# Patient Record
Sex: Male | Born: 1970 | Race: White | Hispanic: No | Marital: Married | Smoking: Current every day smoker
Health system: Southern US, Community
[De-identification: ages and names within clinical notes are randomized; demographics above are authoritative.]

## PROBLEM LIST (undated history)

## (undated) DIAGNOSIS — K219 Gastro-esophageal reflux disease without esophagitis: Secondary | ICD-10-CM

## (undated) HISTORY — DX: Gastro-esophageal reflux disease without esophagitis: K21.9

---

## 1995-08-22 HISTORY — PX: MANDIBLE FRACTURE SURGERY: SHX706

## 1997-08-21 HISTORY — PX: VASECTOMY: SHX75

## 2006-12-15 ENCOUNTER — Emergency Department (HOSPITAL_COMMUNITY): Admission: EM | Admit: 2006-12-15 | Discharge: 2006-12-15 | Payer: Self-pay | Admitting: Emergency Medicine

## 2011-08-22 HISTORY — PX: BACK SURGERY: SHX140

## 2012-09-24 ENCOUNTER — Ambulatory Visit (INDEPENDENT_AMBULATORY_CARE_PROVIDER_SITE_OTHER): Payer: 59 | Admitting: Family Medicine

## 2012-09-24 ENCOUNTER — Encounter: Payer: Self-pay | Admitting: Family Medicine

## 2012-09-24 VITALS — BP 122/84 | HR 60 | Resp 16 | Ht 71.0 in | Wt 211.1 lb

## 2012-09-24 DIAGNOSIS — Z0000 Encounter for general adult medical examination without abnormal findings: Secondary | ICD-10-CM

## 2012-09-24 DIAGNOSIS — M549 Dorsalgia, unspecified: Secondary | ICD-10-CM

## 2012-09-24 DIAGNOSIS — Z125 Encounter for screening for malignant neoplasm of prostate: Secondary | ICD-10-CM

## 2012-09-24 DIAGNOSIS — Z13220 Encounter for screening for lipoid disorders: Secondary | ICD-10-CM

## 2012-09-24 DIAGNOSIS — F17200 Nicotine dependence, unspecified, uncomplicated: Secondary | ICD-10-CM

## 2012-09-24 DIAGNOSIS — G8929 Other chronic pain: Secondary | ICD-10-CM

## 2012-09-24 DIAGNOSIS — Z720 Tobacco use: Secondary | ICD-10-CM

## 2012-09-24 NOTE — Patient Instructions (Signed)
Continue multivitamin  Congratulations on the smoking! 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

## 2012-09-24 NOTE — Progress Notes (Signed)
  Subjective:    Patient ID: William Sweeney, male    DOB: 08-07-1971, 42 y.o.   MRN: [id number]  HPI Patient here to establish care. Previous PCP [facility]. Neurosurgeon Dr. Trey Sailors No current medications. He is on a NicoDerm patches she is doing over-the-counter to help him quit smoking. He's been smoke free for the past 10 days he started smoking at age 55.  Back surgery he has history of lumbar fusion with EKG that was done in March of 2013 he had multiple slipped disc at that time. He still gets stiff in the morning has more pain in the morning as well as stiffness in his right ankle however after he moves around or takes an Aleve he has no difficulties.  He is due for physical exam has not had fasting labs. He is due for tetanus booster.  History was reviewed he did tell me that his paternal uncle and paternal grandmother was diagnosed with a type of lymphoma. Follows with the eye doctor and dentist on a regular basis   Review of Systems   GEN- denies fatigue, fever, weight loss,weakness, recent illness HEENT- denies eye drainage, change in vision, nasal discharge, CVS- denies chest pain, palpitations RESP- denies SOB, cough, wheeze ABD- denies N/V, change in stools, abd pain GU- denies dysuria, hematuria, dribbling, incontinence MSK- + joint pain, muscle aches, injury Neuro- denies headache, dizziness, syncope, seizure activity      Objective:   Physical Exam GEN- NAD, alert and oriented x3 HEENT- PERRL, EOMI, non injected sclera, pink conjunctiva, MMM, oropharynx clear Neck- Supple,  CVS- RRR, no murmur RESP-CTAB EXT- No edema Pulses- Radial 2+ Psych- normal affect and mood       Assessment & Plan:

## 2012-09-25 DIAGNOSIS — M549 Dorsalgia, unspecified: Secondary | ICD-10-CM | POA: Insufficient documentation

## 2012-09-25 DIAGNOSIS — G8929 Other chronic pain: Secondary | ICD-10-CM | POA: Insufficient documentation

## 2012-09-25 NOTE — Assessment & Plan Note (Addendum)
Smoke free for 10 days, continue to encourage, using nicoderm system Declined flu shot

## 2012-09-25 NOTE — Assessment & Plan Note (Signed)
OTC meds only, obtain records

## 2012-10-07 NOTE — Progress Notes (Signed)
Patient ID: William Sweeney, male   DOB: [birth year], 42 y.o.   MRN: [id number] Records received from [facility], treated for URI and epididymitis, no labs, no immunizations

## 2012-10-31 LAB — CBC
MCH: 28.8 pg (ref 26.0–34.0)
MCV: 82.3 fL (ref 78.0–100.0)
Platelets: 291 10*3/uL (ref 150–400)
RBC: 5.77 MIL/uL (ref 4.22–5.81)
RDW: 13.7 % (ref 11.5–15.5)
WBC: 9.2 10*3/uL (ref 4.0–10.5)

## 2012-10-31 LAB — PSA: PSA: 0.71 ng/mL (ref <=4.00)

## 2012-10-31 LAB — COMPREHENSIVE METABOLIC PANEL
ALT: 23 U/L (ref 0–53)
Albumin: 3.9 g/dL (ref 3.5–5.2)
CO2: 26 mEq/L (ref 19–32)
Chloride: 106 mEq/L (ref 96–112)
Glucose, Bld: 100 mg/dL — ABNORMAL HIGH (ref 70–99)
Potassium: 4.1 mEq/L (ref 3.5–5.3)
Sodium: 138 mEq/L (ref 135–145)
Total Bilirubin: 0.5 mg/dL (ref 0.3–1.2)
Total Protein: 6.4 g/dL (ref 6.0–8.3)

## 2012-10-31 LAB — LIPID PANEL: LDL Cholesterol: 95 mg/dL (ref 0–99)

## 2012-11-05 ENCOUNTER — Encounter: Payer: Self-pay | Admitting: Family Medicine

## 2012-11-05 ENCOUNTER — Ambulatory Visit (INDEPENDENT_AMBULATORY_CARE_PROVIDER_SITE_OTHER): Payer: 59 | Admitting: Family Medicine

## 2012-11-05 VITALS — BP 130/86 | HR 86 | Resp 16 | Ht 71.0 in | Wt 217.1 lb

## 2012-11-05 DIAGNOSIS — E669 Obesity, unspecified: Secondary | ICD-10-CM

## 2012-11-05 DIAGNOSIS — Z1211 Encounter for screening for malignant neoplasm of colon: Secondary | ICD-10-CM

## 2012-11-05 DIAGNOSIS — Z0000 Encounter for general adult medical examination without abnormal findings: Secondary | ICD-10-CM

## 2012-11-05 LAB — HEMOCCULT GUIAC POC 1CARD (OFFICE)

## 2012-11-05 NOTE — Progress Notes (Signed)
  Subjective:    Patient ID: William Sweeney, male    DOB: 1971/02/16, 42 y.o.   MRN: [id number]  HPI Patient here for complete physical exam. No specific concerns. His fasting labs were reviewed PSA was normal. He has followup eye doctor and dentist. His immunizations are up-to-date.   Review of Systems  GEN- denies fatigue, fever, weight loss,weakness, recent illness HEENT- denies eye drainage, change in vision, nasal discharge, CVS- denies chest pain, palpitations RESP- denies SOB, cough, wheeze ABD- denies N/V, change in stools, abd pain GU- denies dysuria, hematuria, dribbling, incontinence MSK- denies joint pain, muscle aches, injury Neuro- denies headache, dizziness, syncope, seizure activity      Objective:   Physical Exam GEN- NAD, alert and oriented x3 HEENT- PERRL, EOMI, non injected sclera, pink conjunctiva, MMM, oropharynx clear, TM clear bilat Neck- Supple,  CVS- RRR, no murmur RESP-CTAB ABD-NABS,soft,NT,ND Rectal- FOBT neg, normal tone, normal external appearance, prostate smooth, no enlargement EXT- No edema Pulses- Radial, DP- 2+ Neuro- CNII-XII in tact, no focal deficits       Assessment & Plan:

## 2012-11-05 NOTE — Assessment & Plan Note (Signed)
CPE done, immunizations UTD Labs reviewed Start multivitamin

## 2012-11-05 NOTE — Assessment & Plan Note (Signed)
Discussed healthy weight and exercise

## 2012-11-05 NOTE — Patient Instructions (Signed)
I recommend eye visit once a year I recommend dental visit every 6 months Goal is to  Exercise 30 minutes 5 days a week I recommend multivitamin once a day  F/U as needed or 1 once a year for physical

## 2013-08-06 ENCOUNTER — Encounter: Payer: Self-pay | Admitting: Family Medicine

## 2013-08-06 ENCOUNTER — Ambulatory Visit (INDEPENDENT_AMBULATORY_CARE_PROVIDER_SITE_OTHER): Payer: 59 | Admitting: Family Medicine

## 2013-08-06 VITALS — BP 130/80 | HR 72 | Temp 99.0°F | Resp 18 | Ht 69.0 in | Wt 212.0 lb

## 2013-08-06 DIAGNOSIS — J209 Acute bronchitis, unspecified: Secondary | ICD-10-CM

## 2013-08-06 DIAGNOSIS — J019 Acute sinusitis, unspecified: Secondary | ICD-10-CM

## 2013-08-06 DIAGNOSIS — J029 Acute pharyngitis, unspecified: Secondary | ICD-10-CM

## 2013-08-06 LAB — RAPID STREP SCREEN (MED CTR MEBANE ONLY)

## 2013-08-06 MED ORDER — AMOXICILLIN 500 MG PO CAPS: 500.0000 mg | ORAL_CAPSULE | Freq: Three times a day (TID) | ORAL | Status: DC

## 2013-08-06 MED ORDER — GUAIFENESIN-CODEINE 100-10 MG/5ML PO SOLN: 10.0000 mL | Freq: Three times a day (TID) | ORAL | Status: DC | PRN

## 2013-08-06 NOTE — Progress Notes (Signed)
   Subjective:    Patient ID: William Sweeney, male    DOB: Nov 16, 1970, 42 y.o.   MRN: [id number]  HPI  Patient here with cough with production of green sputum worsened over the past 2 weeks. He also had fever last week. He's had some  sinus drainage as well. He has used some over-the-counter cough and congestion medications with minimal improvement. The cough is keeping him up at night. He is a former smoker. Denies any shortness of breath or chest pain.   Review of Systems  GEN- denies fatigue, fever, weight loss,weakness, recent illness HEENT- denies eye drainage, change in vision, nasal discharge, CVS- denies chest pain, palpitations RESP- denies SOB, cough, wheeze ABD- denies N/V, change in stools, abd pain GU- denies dysuria, hematuria, dribbling, incontinence MSK- denies joint pain, muscle aches, injury Neuro- denies headache, dizziness, syncope, seizure activity      Objective:   Physical Exam  GEN- NAD, alert and oriented x3 HEENT- PERRL, EOMI, non injected sclera, pink conjunctiva, MMM, oropharynx mild injection, TM clear bilat no effusion,  + maxillary sinus tenderness, inflammed turbinates,  Nasal drainage  Neck- Supple, shotty  LAD CVS- RRR, no murmur RESP-CTAB EXT- No edema Pulses- Radial 2+         Assessment & Plan:

## 2013-08-06 NOTE — Patient Instructions (Signed)
Mild Bronchitis and Sinusitis Start antibiotics  Robitussin with Codiene Nasal saline  F/U as needed

## 2013-08-06 NOTE — Assessment & Plan Note (Signed)
Amoxicillin, decongest as needed, nasal saline

## 2013-08-06 NOTE — Assessment & Plan Note (Signed)
Cough meds given.

## 2019-11-18 ENCOUNTER — Encounter (HOSPITAL_COMMUNITY): Payer: Self-pay | Admitting: Emergency Medicine

## 2019-11-18 ENCOUNTER — Emergency Department (HOSPITAL_COMMUNITY)
Admission: EM | Admit: 2019-11-18 | Discharge: 2019-11-18 | Disposition: A | Discharge status: Home / Self Care | Payer: 59 | Attending: Emergency Medicine | Admitting: Emergency Medicine

## 2019-11-18 ENCOUNTER — Other Ambulatory Visit: Payer: Self-pay

## 2019-11-18 DIAGNOSIS — R309 Painful micturition, unspecified: Secondary | ICD-10-CM | POA: Diagnosis not present

## 2019-11-18 DIAGNOSIS — R300 Dysuria: Secondary | ICD-10-CM | POA: Diagnosis not present

## 2019-11-18 DIAGNOSIS — R109 Unspecified abdominal pain: Secondary | ICD-10-CM | POA: Diagnosis present

## 2019-11-18 DIAGNOSIS — Z87891 Personal history of nicotine dependence: Secondary | ICD-10-CM | POA: Insufficient documentation

## 2019-11-18 DIAGNOSIS — R110 Nausea: Secondary | ICD-10-CM | POA: Insufficient documentation

## 2019-11-18 DIAGNOSIS — N200 Calculus of kidney: Secondary | ICD-10-CM

## 2019-11-18 LAB — CBC
HCT: 51.3 % (ref 39.0–52.0)
Hemoglobin: 17 g/dL (ref 13.0–17.0)
MCH: 30.1 pg (ref 26.0–34.0)
MCHC: 33.1 g/dL (ref 30.0–36.0)
MCV: 90.8 fL (ref 80.0–100.0)
Platelets: 287 10*3/uL (ref 150–400)
RBC: 5.65 MIL/uL (ref 4.22–5.81)
RDW: 13 % (ref 11.5–15.5)
WBC: 10 10*3/uL (ref 4.0–10.5)
nRBC: 0 % (ref 0.0–0.2)

## 2019-11-18 LAB — BASIC METABOLIC PANEL
Anion gap: 8 (ref 5–15)
BUN: 17 mg/dL (ref 6–20)
CO2: 24 mmol/L (ref 22–32)
Calcium: 9.2 mg/dL (ref 8.9–10.3)
Chloride: 106 mmol/L (ref 98–111)
Creatinine, Ser: 1.11 mg/dL (ref 0.61–1.24)
GFR calc Af Amer: >60 mL/min (ref >60)
GFR calc non Af Amer: >60 mL/min (ref >60)
Glucose, Bld: 109 mg/dL — ABNORMAL HIGH (ref 70–99)
Potassium: 4.5 mmol/L (ref 3.5–5.1)
Sodium: 138 mmol/L (ref 135–145)

## 2019-11-18 LAB — URINALYSIS, ROUTINE W REFLEX MICROSCOPIC
Bilirubin Urine: NEGATIVE
Glucose, UA: NEGATIVE mg/dL
Ketones, ur: NEGATIVE mg/dL
Leukocytes,Ua: NEGATIVE
Nitrite: POSITIVE — AB
Protein, ur: 100 mg/dL — AB
RBC / HPF: >50 RBC/hpf — ABNORMAL HIGH (ref 0–5)
Specific Gravity, Urine: 1.026 (ref 1.005–1.030)
pH: 5 (ref 5.0–8.0)

## 2019-11-18 NOTE — ED Triage Notes (Signed)
Pt reports waking up with left sided kidney pain. Nausea with pain but pain has subsided at this time. Reports taking 3 ibuprofen and AZO before 4 am. Pain when voiding this AM.

## 2019-11-18 NOTE — Discharge Instructions (Signed)
Most likely you passed a kidney stone today which is what caused the blood in your urine and the pain in your left side.  It is possible that the stone is still present and is just not moving right now.  It is possible that the pain could return which you can take ibuprofen for.  However if you continue to have burning when you urinate or start developing a fever or vomiting please return to the emergency room.  Urine culture was done today and results should be present in the next 2 days.  If there are signs of infection you will need an antibiotic.

## 2019-11-18 NOTE — ED Provider Notes (Signed)
[facility] EMERGENCY DEPARTMENT Provider Note   CSN: [id number] Arrival date & time: 11/18/19  4765     History Chief Complaint  Patient presents with  . Flank Pain    William Sweeney is a 49 y.o. male.  Patient is a 50 year old male with a history of chronic back pain and bronchitis presenting today with left flank pain and dysuria.  Patient reports he woke up today with pain in his left flank that continued to escalate to a 10 out of 10 at one point that radiated around to his groin and caused him to have nausea but no vomiting.  He is also noticed since waking up this morning he has had some mild dysuria.  Usually some mild burning right at the beginning of his stream that then resolves.  He did take AZO at home and ibuprofen.  He reports now the pain has resolved but he still had some mild burning when he urinated.  He denies any chronic issues with urination.  No penile discharge.  No fever, diarrhea or other complaints at this time.  He takes no medications regularly.  Currently pain is 0 out of 10.   Flank Pain       History reviewed. No pertinent past medical history.  Patient Active Problem List   Diagnosis Date Noted  . Acute sinusitis 08/06/2013  . Acute bronchitis 08/06/2013  . Routine general medical examination at a health care facility 11/05/2012  . Obesity, unspecified 11/05/2012  . Chronic back pain 09/25/2012    Past Surgical History:  Procedure Laterality Date  . BACK SURGERY  2013  . MANDIBLE FRACTURE SURGERY  1997  . VASECTOMY  1999       Family History  Problem Relation Age of Onset  . Hypertension Mother   . Heart disease Father   . Hyperlipidemia Father     Social History   Tobacco Use  . Smoking status: Former Games developer  . Tobacco comment: trying to quit   Substance Use Topics  . Alcohol use: No  . Drug use: No    Home Medications Prior to Admission medications   Medication Sig Start Date End Date Taking?  Authorizing Provider  ibuprofen (ADVIL) 200 MG tablet Take 600 mg by mouth daily as needed for moderate pain.   Yes [provider]  amoxicillin (AMOXIL) 500 MG capsule Take 1 capsule (500 mg total) by mouth 3 (three) times daily. Patient not taking: Reported on 11/18/2019 08/06/13   Salley Scarlet, MD  guaiFENesin-codeine 100-10 MG/5ML syrup Take 10 mLs by mouth 3 (three) times daily as needed for cough. Patient not taking: Reported on 11/18/2019 08/06/13   Salley Scarlet, MD    Allergies    Patient has no known allergies.  Review of Systems   Review of Systems  Genitourinary: Positive for flank pain.  All other systems reviewed and are negative.   Physical Exam Updated Vital Signs BP (!) 155/106   Pulse 62   Temp 98.4 F (36.9 C) (Oral)   Resp 16   Ht 5\' 10"  (1.778 m)   Wt 95.3 kg   SpO2 98%   BMI 30.13 kg/m   Physical Exam Vitals and nursing note reviewed.  Constitutional:      General: He is not in acute distress.    Appearance: He is well-developed.  HENT:     Head: Normocephalic and atraumatic.  Eyes:     Conjunctiva/sclera: Conjunctivae normal.     Pupils:  Pupils are equal, round, and reactive to light.  Cardiovascular:     Rate and Rhythm: Normal rate and regular rhythm.     Heart sounds: No murmur.  Pulmonary:     Effort: Pulmonary effort is normal. No respiratory distress.     Breath sounds: Normal breath sounds. No wheezing or rales.  Abdominal:     General: There is no distension.     Palpations: Abdomen is soft.     Tenderness: There is no abdominal tenderness. There is no right CVA tenderness, left CVA tenderness, guarding or rebound.  Musculoskeletal:        General: No tenderness. Normal range of motion.     Cervical back: Normal range of motion and neck supple.  Skin:    General: Skin is warm and dry.     Findings: No erythema or rash.  Neurological:     General: No focal deficit present.     Mental Status: He is alert and  oriented to person, place, and time.  Psychiatric:        Mood and Affect: Mood normal.        Behavior: Behavior normal.        Thought Content: Thought content normal.     ED Results / Procedures / Treatments   Labs (all labs ordered are listed, but only abnormal results are displayed) Labs Reviewed  URINALYSIS, ROUTINE W REFLEX MICROSCOPIC - Abnormal; Notable for the following components:      Result Value   Color, Urine AMBER (*)    APPearance CLOUDY (*)    Hgb urine dipstick LARGE (*)    Protein, ur 100 (*)    Nitrite POSITIVE (*)    RBC / HPF >50 (*)    Bacteria, UA FEW (*)    All other components within normal limits  BASIC METABOLIC PANEL - Abnormal; Notable for the following components:   Glucose, Bld 109 (*)    All other components within normal limits  CBC    EKG None  Radiology No results found.  Procedures Procedures (including critical care time)  Medications Ordered in ED Medications - No data to display  ED Course  I have reviewed the triage vital signs and the nursing notes.  Pertinent labs & imaging results that were available during my care of the patient were reviewed by me and considered in my medical decision making (see chart for details).    MDM Rules/Calculators/A&P                      Pt with symptoms consistent with kidney stone.  Denies infectious sx, or GI symptoms.  Low concern for diverticulitis and no risk factors or history suggestive of AAA.  No hx suggestive of GU source (discharge) and otherwise pt is healthy.  UA with nitrite positive but he had just taken AZO as well as large hemoglobin and greater than 50 red cells.  Patient has rare bacteria but 6-10 white cells.  We will send a culture but that this time low suspicion for UTI.  CBC and BMP without acute findings.  Given patient symptoms have now completely resolved other than some mild dysuria at the beginning of his stream do not feel that he warrants imaging.  Did recommend  if the pain returns still most likely a kidney stone and if it does not resolve he can either return to the emergency room or follow-up with urology.  Final Clinical Impression(s) / ED Diagnoses Final  diagnoses:  Kidney stone    Rx / DC Orders ED Discharge Orders    None       Gwyneth Sprout, MD 11/18/19 [phone]

## 2019-11-19 LAB — URINE CULTURE: Culture: NO GROWTH

## 2019-12-22 ENCOUNTER — Encounter (HOSPITAL_COMMUNITY): Payer: Self-pay | Admitting: Emergency Medicine

## 2019-12-22 ENCOUNTER — Emergency Department (HOSPITAL_COMMUNITY): Payer: 59

## 2019-12-22 ENCOUNTER — Other Ambulatory Visit: Payer: Self-pay

## 2019-12-22 ENCOUNTER — Emergency Department (HOSPITAL_COMMUNITY)
Admission: EM | Admit: 2019-12-22 | Discharge: 2019-12-22 | Disposition: A | Discharge status: Home / Self Care | Payer: 59 | Attending: Emergency Medicine | Admitting: Emergency Medicine

## 2019-12-22 DIAGNOSIS — Z79899 Other long term (current) drug therapy: Secondary | ICD-10-CM | POA: Diagnosis not present

## 2019-12-22 DIAGNOSIS — N200 Calculus of kidney: Secondary | ICD-10-CM | POA: Insufficient documentation

## 2019-12-22 DIAGNOSIS — Z87891 Personal history of nicotine dependence: Secondary | ICD-10-CM | POA: Diagnosis not present

## 2019-12-22 DIAGNOSIS — R109 Unspecified abdominal pain: Secondary | ICD-10-CM | POA: Diagnosis present

## 2019-12-22 LAB — BASIC METABOLIC PANEL
Anion gap: 11 (ref 5–15)
BUN: 21 mg/dL — ABNORMAL HIGH (ref 6–20)
CO2: 23 mmol/L (ref 22–32)
Calcium: 9.6 mg/dL (ref 8.9–10.3)
Chloride: 106 mmol/L (ref 98–111)
Creatinine, Ser: 1.55 mg/dL — ABNORMAL HIGH (ref 0.61–1.24)
GFR calc Af Amer: >60 mL/min (ref >60)
GFR calc non Af Amer: 52 mL/min — ABNORMAL LOW (ref >60)
Glucose, Bld: 128 mg/dL — ABNORMAL HIGH (ref 70–99)
Potassium: 3.9 mmol/L (ref 3.5–5.1)
Sodium: 140 mmol/L (ref 135–145)

## 2019-12-22 LAB — HEPATIC FUNCTION PANEL
ALT: 22 U/L (ref 0–44)
AST: 21 U/L (ref 15–41)
Albumin: 4.8 g/dL (ref 3.5–5.0)
Alkaline Phosphatase: 81 U/L (ref 38–126)
Bilirubin, Direct: 0.1 mg/dL (ref 0.0–0.2)
Indirect Bilirubin: 0.7 mg/dL (ref 0.3–0.9)
Total Bilirubin: 0.8 mg/dL (ref 0.3–1.2)
Total Protein: 7.8 g/dL (ref 6.5–8.1)

## 2019-12-22 LAB — CBC
HCT: 52.5 % — ABNORMAL HIGH (ref 39.0–52.0)
Hemoglobin: 17.3 g/dL — ABNORMAL HIGH (ref 13.0–17.0)
MCH: 30 pg (ref 26.0–34.0)
MCHC: 33 g/dL (ref 30.0–36.0)
MCV: 91 fL (ref 80.0–100.0)
Platelets: 285 10*3/uL (ref 150–400)
RBC: 5.77 MIL/uL (ref 4.22–5.81)
RDW: 13.1 % (ref 11.5–15.5)
WBC: 24.1 10*3/uL — ABNORMAL HIGH (ref 4.0–10.5)
nRBC: 0 % (ref 0.0–0.2)

## 2019-12-22 LAB — URINALYSIS, ROUTINE W REFLEX MICROSCOPIC
Bilirubin Urine: NEGATIVE
Glucose, UA: 50 mg/dL — AB
Ketones, ur: 5 mg/dL — AB
Leukocytes,Ua: NEGATIVE
Nitrite: POSITIVE — AB
Protein, ur: 30 mg/dL — AB
RBC / HPF: >50 RBC/hpf — ABNORMAL HIGH (ref 0–5)
Specific Gravity, Urine: 1.023 (ref 1.005–1.030)
pH: 5 (ref 5.0–8.0)

## 2019-12-22 MED ORDER — HYDROCODONE-ACETAMINOPHEN 5-325 MG PO TABS: 1.0000 | ORAL_TABLET | Freq: Four times a day (QID) | ORAL | 0 refills | Status: AC | PRN

## 2019-12-22 MED ORDER — TAMSULOSIN HCL 0.4 MG PO CAPS: 0.4000 mg | ORAL_CAPSULE | Freq: Every day | ORAL | 0 refills | Status: DC

## 2019-12-22 MED ORDER — CEPHALEXIN 500 MG PO CAPS: 500.0000 mg | ORAL_CAPSULE | Freq: Four times a day (QID) | ORAL | 0 refills | Status: AC

## 2019-12-22 MED ORDER — ONDANSETRON 4 MG PO TBDP: ORAL_TABLET | ORAL | 0 refills | Status: AC

## 2019-12-22 MED ORDER — HYDROMORPHONE HCL 1 MG/ML IJ SOLN
0.5000 mg | Freq: Once | INTRAMUSCULAR | Status: AC
  Administered 2019-12-22: 12:00:00 0.5 mg via INTRAVENOUS
  Filled 2019-12-22: qty 1

## 2019-12-22 MED ORDER — KETOROLAC TROMETHAMINE 30 MG/ML IJ SOLN
30.0000 mg | Freq: Once | INTRAMUSCULAR | Status: AC
  Administered 2019-12-22: 30 mg via INTRAVENOUS
  Filled 2019-12-22: qty 1

## 2019-12-22 MED ORDER — KETOROLAC TROMETHAMINE 30 MG/ML IJ SOLN: 30.0000 mg | Freq: Once | INTRAMUSCULAR | Status: DC

## 2019-12-22 MED ORDER — ONDANSETRON HCL 4 MG/2ML IJ SOLN: 4.0000 mg | Freq: Once | INTRAMUSCULAR | Status: DC

## 2019-12-22 MED ORDER — ONDANSETRON HCL 4 MG/2ML IJ SOLN
4.0000 mg | Freq: Once | INTRAMUSCULAR | Status: AC
  Administered 2019-12-22: 11:00:00 4 mg via INTRAVENOUS
  Filled 2019-12-22: qty 2

## 2019-12-22 NOTE — ED Notes (Signed)
Pt not able to void at this time.

## 2019-12-22 NOTE — ED Notes (Signed)
Ambulated to BR without difficulty.  Attempting urine sample.

## 2019-12-22 NOTE — Discharge Instructions (Signed)
Follow-up with [facility] the end of this week or the beginning next week

## 2019-12-22 NOTE — ED Triage Notes (Addendum)
Pt reports left flank pain that radiates to LLQ of abdomen. Pt reports pain onset 430 this am with emesis,nausea. Pt reports urinates but reports "not emptying bladder completely." pt diaphoretic in triage.

## 2019-12-22 NOTE — ED Provider Notes (Signed)
[facility] EMERGENCY DEPARTMENT Provider Note   CSN: [id number] Arrival date & time: 12/22/19  7124     History Chief Complaint  Patient presents with   Flank Pain    William Sweeney is a 49 y.o. male.  Patient with left flank pain, and nausea which started a few days ago  The history is provided by the patient. No language interpreter was used.  Flank Pain This is a new problem. The current episode started 2 days ago. The problem occurs constantly. The problem has not changed since onset.Pertinent negatives include no chest pain, no abdominal pain and no headaches. Nothing aggravates the symptoms. Nothing relieves the symptoms. He has tried nothing for the symptoms. The treatment provided no relief.       History reviewed. No pertinent past medical history.  Patient Active Problem List   Diagnosis Date Noted   Acute sinusitis 08/06/2013   Acute bronchitis 08/06/2013   Routine general medical examination at a health care facility 11/05/2012   Obesity, unspecified 11/05/2012   Chronic back pain 09/25/2012    Past Surgical History:  Procedure Laterality Date   BACK SURGERY  2013   MANDIBLE FRACTURE SURGERY  1997   VASECTOMY  1999       Family History  Problem Relation Age of Onset   Hypertension Mother    Heart disease Father    Hyperlipidemia Father     Social History   Tobacco Use   Smoking status: Former Smoker   Smokeless tobacco: Never Used   Tobacco comment: trying to quit   Substance Use Topics   Alcohol use: No   Drug use: No    Home Medications Prior to Admission medications   Medication Sig Start Date End Date Taking? Authorizing Provider  ibuprofen (ADVIL) 200 MG tablet Take 600 mg by mouth daily as needed for moderate pain.   Yes [provider]  phenazopyridine (PYRIDIUM) 95 MG tablet Take 190 mg by mouth 3 (three) times daily as needed for pain.   Yes [provider]  cephALEXin (KEFLEX) 500 MG capsule  Take 1 capsule (500 mg total) by mouth 4 (four) times daily. 12/22/19   Bethann Berkshire, MD  HYDROcodone-acetaminophen (NORCO/VICODIN) 5-325 MG tablet Take 1 tablet by mouth every 6 (six) hours as needed. 12/22/19   Bethann Berkshire, MD  ondansetron (ZOFRAN ODT) 4 MG disintegrating tablet 4mg  ODT q4 hours prn nausea/vomit 12/22/19   02/21/20, MD  tamsulosin (FLOMAX) 0.4 MG CAPS capsule Take 1 capsule (0.4 mg total) by mouth daily. 12/22/19   02/21/20, MD    Allergies    Patient has no known allergies.  Review of Systems   Review of Systems  Constitutional: Negative for appetite change and fatigue.  HENT: Negative for congestion, ear discharge and sinus pressure.   Eyes: Negative for discharge.  Respiratory: Negative for cough.   Cardiovascular: Negative for chest pain.  Gastrointestinal: Negative for abdominal pain and diarrhea.  Genitourinary: Positive for flank pain. Negative for frequency and hematuria.  Musculoskeletal: Negative for back pain.  Skin: Negative for rash.  Neurological: Negative for seizures and headaches.  Psychiatric/Behavioral: Negative for hallucinations.    Physical Exam Updated Vital Signs BP 127/71 (BP Location: Right Arm)    Pulse 67    Temp 99.1 F (37.3 C) (Oral)    Resp 18    Ht 5\' 10"  (1.778 m)    Wt 97.5 kg    SpO2 98%    BMI 30.85 kg/m  Physical Exam Vitals and nursing note reviewed.  Constitutional:      Appearance: He is well-developed.  HENT:     Head: Normocephalic.     Nose: Nose normal.  Eyes:     General: No scleral icterus.    Conjunctiva/sclera: Conjunctivae normal.  Neck:     Thyroid: No thyromegaly.  Cardiovascular:     Rate and Rhythm: Normal rate and regular rhythm.     Heart sounds: No murmur. No friction rub. No gallop.   Pulmonary:     Breath sounds: No stridor. No wheezing or rales.  Chest:     Chest wall: No tenderness.  Abdominal:     General: There is no distension.     Tenderness: There is no abdominal  tenderness. There is no rebound.  Genitourinary:    Comments: Tender left flank Musculoskeletal:        General: Normal range of motion.     Cervical back: Neck supple.  Lymphadenopathy:     Cervical: No cervical adenopathy.  Skin:    Findings: No erythema or rash.  Neurological:     Mental Status: He is alert and oriented to person, place, and time.     Motor: No abnormal muscle tone.     Coordination: Coordination normal.  Psychiatric:        Behavior: Behavior normal.     ED Results / Procedures / Treatments   Labs (all labs ordered are listed, but only abnormal results are displayed) Labs Reviewed  URINALYSIS, ROUTINE W REFLEX MICROSCOPIC - Abnormal; Notable for the following components:      Result Value   Color, Urine AMBER (*)    Glucose, UA 50 (*)    Hgb urine dipstick MODERATE (*)    Ketones, ur 5 (*)    Protein, ur 30 (*)    Nitrite POSITIVE (*)    RBC / HPF >50 (*)    Bacteria, UA RARE (*)    All other components within normal limits  BASIC METABOLIC PANEL - Abnormal; Notable for the following components:   Glucose, Bld 128 (*)    BUN 21 (*)    Creatinine, Ser 1.55 (*)    GFR calc non Af Amer 52 (*)    All other components within normal limits  CBC - Abnormal; Notable for the following components:   WBC 24.1 (*)    Hemoglobin 17.3 (*)    HCT 52.5 (*)    All other components within normal limits  URINE CULTURE  HEPATIC FUNCTION PANEL    EKG None  Radiology CT Renal Stone Study  Result Date: 12/22/2019 CLINICAL DATA:  Onset left flank pain radiating into the left lower quadrant at 4:30 a.m. today. EXAM: CT ABDOMEN AND PELVIS WITHOUT CONTRAST TECHNIQUE: Multidetector CT imaging of the abdomen and pelvis was performed following the standard protocol without IV contrast. COMPARISON:  None. FINDINGS: Lower chest: Lung bases clear.  No pleural or pericardial effusion. Hepatobiliary: A few small low attenuating foci in the liver such as a 0.8 cm lesion in the  caudate lobe on image 24 cannot be definitively characterized but are likely cysts. The liver otherwise appears normal. Gallbladder and biliary tree normal in appearance. Pancreas: Unremarkable. No pancreatic ductal dilatation or surrounding inflammatory changes. Spleen: Calcifications in the spleen consistent with old granulomatous disease noted. The spleen is normal in size. Adrenals/Urinary Tract: Small bilateral adrenal adenomas are noted. There is mild-to-moderate left hydronephrosis and stranding about the left kidney and proximal ureter due  to a 0.5 cm proximal to mid left ureteral stone. No other urinary tract stones are seen on the right or left. The kidneys are otherwise normal appearance. Urinary bladder appears normal. Stomach/Bowel: Stomach is within normal limits. Appendix appears normal. No evidence of bowel wall thickening, distention, or inflammatory changes. Vascular/Lymphatic: No significant vascular findings are present. No enlarged abdominal or pelvic lymph nodes. Reproductive: Prostate is unremarkable. Other: None. Musculoskeletal: No acute or focal bony abnormality. 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 The patient is status post L5-S1 discectomy and fusion. IMPRESSION: Mild to moderate left hydronephrosis due to a 0.5 cm proximal to mid left ureteral stone. No other acute abnormality. Electronically Signed   By: Drusilla Kanner M.D.   On: 12/22/2019 12:09    Procedures Procedures (including critical care time)  Medications Ordered in ED Medications  ondansetron (ZOFRAN) injection 4 mg (4 mg Intravenous Given 12/22/19 1110)  ketorolac (TORADOL) 30 MG/ML injection 30 mg (30 mg Intravenous Given 12/22/19 1110)  HYDROmorphone (DILAUDID) injection 0.5 mg (0.5 mg Intravenous Given 12/22/19 1132)    ED Course  I have reviewed the triage vital signs and the nursing notes.  Pertinent labs & imaging results that were available during my care of the patient were reviewed  by me and considered in my medical decision making (see chart for details).    MDM Rules/Calculators/A&P                      Patient with a kidney stone.  He was discharged on Zofran Keflex Flomax and Vicodin and will follow up with urology     This patient presents to the ED for concern of flank pain this involves an extensive number of treatment options, and is a complaint that carries with it a high risk of complications and morbidity.  The differential diagnosis includes kidney stone urinary tract infection   Lab Tests:   I Ordered, reviewed, and interpreted labs, which included CBC chemistries urinalysis  Medicines ordered:   I ordered medication Zofran Dilaudid Toradol for pain and nausea  Imaging Studies ordered:   I ordered imaging studies which included CT scan abdomen and  I independently visualized and interpreted imaging which showed ureteral stone on the left  Additional history obtained:   Additional history obtained from records  Previous records obtained and reviewed  Consultations Obtained:   Reevaluation:  After the interventions stated above, I reevaluated the patient and found much improved  Critical Interventions:     Final Clinical Impression(s) / ED Diagnoses Final diagnoses:  Kidney stone    Rx / DC Orders ED Discharge Orders         Ordered    ondansetron (ZOFRAN ODT) 4 MG disintegrating tablet     12/22/19 1519    cephALEXin (KEFLEX) 500 MG capsule  4 times daily     12/22/19 1519    tamsulosin (FLOMAX) 0.4 MG CAPS capsule  Daily     12/22/19 1519    HYDROcodone-acetaminophen (NORCO/VICODIN) 5-325 MG tablet  Every 6 hours PRN     12/22/19 1519           Bethann Berkshire, MD 12/23/19 1615

## 2019-12-23 LAB — URINE CULTURE: Culture: NO GROWTH

## 2019-12-29 ENCOUNTER — Encounter: Payer: Self-pay | Admitting: Urology

## 2019-12-29 ENCOUNTER — Ambulatory Visit: Payer: 59 | Admitting: Urology

## 2019-12-29 ENCOUNTER — Other Ambulatory Visit: Payer: Self-pay

## 2019-12-29 VITALS — BP 143/71 | HR 64 | Temp 98.8°F | Ht 70.0 in | Wt 215.0 lb

## 2019-12-29 DIAGNOSIS — N200 Calculus of kidney: Secondary | ICD-10-CM | POA: Insufficient documentation

## 2019-12-29 LAB — POCT URINALYSIS DIPSTICK
Bilirubin, UA: NEGATIVE
Glucose, UA: NEGATIVE
Ketones, UA: NEGATIVE
Nitrite, UA: NEGATIVE
Protein, UA: NEGATIVE
Spec Grav, UA: <=1.005 — AB (ref 1.010–1.025)
Urobilinogen, UA: 0.2 E.U./dL
pH, UA: 6 (ref 5.0–8.0)

## 2019-12-29 MED ORDER — OXYCODONE-ACETAMINOPHEN 5-325 MG PO TABS: 1.0000 | ORAL_TABLET | ORAL | 0 refills | Status: AC | PRN

## 2019-12-29 MED ORDER — TAMSULOSIN HCL 0.4 MG PO CAPS: 0.4000 mg | ORAL_CAPSULE | Freq: Every day | ORAL | 0 refills | Status: DC

## 2019-12-29 NOTE — Progress Notes (Signed)
Urological Symptom Review  Patient is experiencing the following symptoms: Burning/pain with urination Get up at night to urinate Stream starts and stops Trouble starting stream Weak stream   Review of Systems  Gastrointestinal (upper)  : Negative for upper GI symptoms  Gastrointestinal (lower) : Negative for lower GI symptoms  Constitutional : Negative for symptoms  Skin: Negative for skin symptoms  Eyes: Negative for eye symptoms  Ear/Nose/Throat : Negative for Ear/Nose/Throat symptoms  Hematologic/Lymphatic: Negative for Hematologic/Lymphatic symptoms  Cardiovascular : Negative for cardiovascular symptoms  Respiratory : Negative for respiratory symptoms  Endocrine: Negative for endocrine symptoms  Musculoskeletal: Back pain  Neurological: Negative for neurological symptoms  Psychologic: Negative for psychiatric symptoms

## 2019-12-29 NOTE — Progress Notes (Signed)
12/29/2019 3:57 PM   William Sweeney 03/06/1971 [id number]  Referring provider: No referring provider defined for this encounter.  Left flank pain  HPI: William Sweeney is a 48yo her for evaluation of nephrolithiasis. On 5/3 he developed sharp, intermittent severe and nonradiating left flank pain. CT from [facility] shows a 56mm proximal ureteral calculus. This is his first stone event. NO family hx of nephrolithiasis. No LUTS. No hematuria. UA today shows intact blood. No flank pain currently. He does not believe he passed his calculus.  No nausea or vomiting. No fevers/chills/sweats  PMH: Past Medical History:  Diagnosis Date  . Acid reflux     Surgical History: Past Surgical History:  Procedure Laterality Date  . BACK SURGERY  2013  . MANDIBLE FRACTURE SURGERY  1997  . VASECTOMY  1999    Home Medications:  Allergies as of 12/29/2019   No Known Allergies     Medication List       Accurate as of Dec 29, 2019  3:57 PM. If you have any questions, ask your nurse or doctor.        cephALEXin 500 MG capsule Commonly known as: KEFLEX Take 1 capsule (500 mg total) by mouth 4 (four) times daily.   HYDROcodone-acetaminophen 5-325 MG tablet Commonly known as: NORCO/VICODIN Take 1 tablet by mouth every 6 (six) hours as needed.   ibuprofen 200 MG tablet Commonly known as: ADVIL Take 600 mg by mouth daily as needed for moderate pain.   ondansetron 4 MG disintegrating tablet Commonly known as: Zofran ODT 4mg  ODT q4 hours prn nausea/vomit   phenazopyridine 95 MG tablet Commonly known as: PYRIDIUM Take 190 mg by mouth 3 (three) times daily as needed for pain.   tamsulosin 0.4 MG Caps capsule Commonly known as: Flomax Take 1 capsule (0.4 mg total) by mouth daily.       Allergies: No Known Allergies  Family History: Family History  Problem Relation Age of Onset  . Hypertension Mother   . Heart disease Father   . Hyperlipidemia Father   . Heart attack Father     Social  History:  reports that he has been smoking. He has a 34.00 pack-year smoking history. He has never used smokeless tobacco. He reports that he does not drink alcohol or use drugs.  ROS: All other review of systems were reviewed and are negative except what is noted above in HPI  Physical Exam: BP (!) 143/71   Pulse 64   Temp 98.8 F (37.1 C)   Ht 5\' 10"  (1.778 m)   Wt 215 lb (97.5 kg)   BMI 30.85 kg/m   Constitutional:  Alert and oriented, No acute distress. HEENT: [place] AT, moist mucus membranes.  Trachea midline, no masses. Cardiovascular: No clubbing, cyanosis, or edema. Respiratory: Normal respiratory effort, no increased work of breathing. GI: Abdomen is soft, nontender, nondistended, no abdominal masses GU: No CVA tenderness.  Lymph: No cervical or inguinal lymphadenopathy. Skin: No rashes, bruises or suspicious lesions. Neurologic: Grossly intact, no focal deficits, moving all 4 extremities. Psychiatric: Normal mood and affect.  Laboratory Data: Lab Results  Component Value Date   WBC 24.1 (H) 12/22/2019   HGB 17.3 (H) 12/22/2019   HCT 52.5 (H) 12/22/2019   MCV 91.0 12/22/2019   PLT 285 12/22/2019    Lab Results  Component Value Date   CREATININE 1.55 (H) 12/22/2019    Lab Results  Component Value Date   PSA 0.71 09/24/2012    No results  found for: TESTOSTERONE  No results found for: HGBA1C  Urinalysis    Component Value Date/Time   COLORURINE AMBER (A) 12/22/2019 1410   APPEARANCEUR CLEAR 12/22/2019 1410   LABSPEC 1.023 12/22/2019 1410   PHURINE 5.0 12/22/2019 1410   GLUCOSEU 50 (A) 12/22/2019 1410   HGBUR MODERATE (A) 12/22/2019 1410   BILIRUBINUR neg 12/29/2019 1553   KETONESUR 5 (A) 12/22/2019 1410   PROTEINUR Negative 12/29/2019 1553   PROTEINUR 30 (A) 12/22/2019 1410   UROBILINOGEN 0.2 12/29/2019 1553   NITRITE neg 12/29/2019 1553   NITRITE POSITIVE (A) 12/22/2019 1410   LEUKOCYTESUR Small (1+) (A) 12/29/2019 1553   LEUKOCYTESUR NEGATIVE  12/22/2019 1410    Lab Results  Component Value Date   BACTERIA RARE (A) 12/22/2019    Pertinent Imaging: CT 5/3: images reviewed and discussed with patient No results found for this or any previous visit. No results found for this or any previous visit. No results found for this or any previous visit. No results found for this or any previous visit. No results found for this or any previous visit. No results found for this or any previous visit. No results found for this or any previous visit. Results for orders placed during the hospital encounter of 12/22/19  CT Renal Stone Study   Narrative CLINICAL DATA:  Onset left flank pain radiating into the left lower quadrant at 4:30 a.m. today.  EXAM: CT ABDOMEN AND PELVIS WITHOUT CONTRAST  TECHNIQUE: Multidetector CT imaging of the abdomen and pelvis was performed following the standard protocol without IV contrast.  COMPARISON:  None.  FINDINGS: Lower chest: Lung bases clear.  No pleural or pericardial effusion.  Hepatobiliary: A few small low attenuating foci in the liver such as a 0.8 cm lesion in the caudate lobe on image 24 cannot be definitively characterized but are likely cysts. The liver otherwise appears normal. Gallbladder and biliary tree normal in appearance.  Pancreas: Unremarkable. No pancreatic ductal dilatation or surrounding inflammatory changes.  Spleen: Calcifications in the spleen consistent with old granulomatous disease noted. The spleen is normal in size.  Adrenals/Urinary Tract: Small bilateral adrenal adenomas are noted.  There is mild-to-moderate left hydronephrosis and stranding about the left kidney and proximal ureter due to a 0.5 cm proximal to mid left ureteral stone. No other urinary tract stones are seen on the right or left. The kidneys are otherwise normal appearance. Urinary bladder appears normal.  Stomach/Bowel: Stomach is within normal limits. Appendix appears normal. No  evidence of bowel wall thickening, distention, or inflammatory changes.  Vascular/Lymphatic: No significant vascular findings are present. No enlarged abdominal or pelvic lymph nodes.  Reproductive: Prostate is unremarkable.  Other: None.  Musculoskeletal: No acute or focal bony abnormality. 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 The patient is status post L5-S1 discectomy and fusion.  IMPRESSION: Mild to moderate left hydronephrosis due to a 0.5 cm proximal to mid left ureteral stone. No other acute abnormality.   Electronically Signed   By: Drusilla Kanner M.D.   On: 12/22/2019 12:09     Assessment & Plan:    1. Kidney stones --We discussed the management of kidney stones. These options include observation, ureteroscopy, shockwave lithotripsy (ESWL) and percutaneous nephrolithotomy (PCNL). We discussed which options are relevant to the patient's stone(s). We discussed the natural history of kidney stones as well as the complications of untreated stones and the impact on quality of life without treatment as well as with each of the above  listed treatments. We also discussed the efficacy of each treatment in its ability to clear the stone burden. With any of these management options I discussed the signs and symptoms of infection and the need for emergent treatment should these be experienced. For each option we discussed the ability of each procedure to clear the patient of their stone burden.   For observation I described the risks which include but are not limited to silent renal damage, life-threatening infection, need for emergent surgery, failure to pass stone and pain.   For ureteroscopy I described the risks which include bleeding, infection, damage to contiguous structures, positioning injury, ureteral stricture, ureteral avulsion, ureteral injury, need for prolonged ureteral stent, inability to perform ureteroscopy, need for an interval procedure,  inability to clear stone burden, stent discomfort/pain, heart attack, stroke, pulmonary embolus and the inherent risks with general anesthesia.   For shockwave lithotripsy I described the risks which include arrhythmia, kidney contusion, kidney hemorrhage, need for transfusion, pain, inability to adequately break up stone, inability to pass stone fragments, Steinstr[place], infection associated with obstructing stones, need for alternate surgical procedure, need for repeat shockwave lithotripsy, MI, CVA, PE and the inherent risks with anesthesia/conscious sedation.   For PCNL I described the risks including positioning injury, pneumothorax, hydrothorax, need for chest tube, inability to clear stone burden, renal laceration, arterial venous fistula or malformation, need for embolization of kidney, loss of kidney or renal function, need for repeat procedure, need for prolonged nephrostomy tube, ureteral avulsion, MI, CVA, PE and the inherent risks of general anesthesia.   - The patient would like to proceed with medical expulsive therapy. Rx for flomax and percocet given  - POCT urinalysis dipstick   No follow-ups on file.  Nicolette Bang, MD  [facility] [place]

## 2019-12-29 NOTE — Patient Instructions (Signed)

## 2020-01-12 ENCOUNTER — Other Ambulatory Visit: Payer: Self-pay

## 2020-01-12 ENCOUNTER — Ambulatory Visit (INDEPENDENT_AMBULATORY_CARE_PROVIDER_SITE_OTHER): Payer: 59 | Admitting: Urology

## 2020-01-12 ENCOUNTER — Encounter: Payer: Self-pay | Admitting: Urology

## 2020-01-12 VITALS — BP 136/75 | HR 61 | Temp 98.7°F | Ht 70.0 in | Wt 215.0 lb

## 2020-01-12 DIAGNOSIS — N200 Calculus of kidney: Secondary | ICD-10-CM

## 2020-01-12 NOTE — Progress Notes (Signed)
01/12/2020 11:38 AM   William Sweeney 1971/05/26 [id number]  Referring provider: No referring provider defined for this encounter.  Nephrolithiasis  HPI: William Sweeney is a 49yo here for followup for nephrolithaisis. His urine was dark brown on Saturday and then he thinks he passed his calculus. No flank pain. NO fevers/no dysuria. Pt did not get KUB   PMH: Past Medical History:  Diagnosis Date  . Acid reflux     Surgical History: Past Surgical History:  Procedure Laterality Date  . BACK SURGERY  2013  . [place]  . VASECTOMY  1999    Home Medications:  Allergies as of 01/12/2020   No Known Allergies     Medication List       Accurate as of Jan 12, 2020 11:38 AM. If you have any questions, ask your nurse or doctor.        cephALEXin 500 MG capsule Commonly known as: KEFLEX Take 1 capsule (500 mg total) by mouth 4 (four) times daily.   HYDROcodone-acetaminophen 5-325 MG tablet Commonly known as: NORCO/VICODIN Take 1 tablet by mouth every 6 (six) hours as needed.   ibuprofen 200 MG tablet Commonly known as: ADVIL Take 600 mg by mouth daily as needed for moderate pain.   ondansetron 4 MG disintegrating tablet Commonly known as: Zofran ODT 4mg  ODT q4 hours prn nausea/vomit   oxyCODONE-acetaminophen 5-325 MG tablet Commonly known as: Percocet Take 1 tablet by mouth every 4 (four) hours as needed for moderate pain or severe pain.   phenazopyridine 95 MG tablet Commonly known as: PYRIDIUM Take 190 mg by mouth 3 (three) times daily as needed for pain.   tamsulosin 0.4 MG Caps capsule Commonly known as: Flomax Take 1 capsule (0.4 mg total) by mouth daily.       Allergies: No Known Allergies  Family History: Family History  Problem Relation Age of Onset  . Hypertension Mother   . Heart disease Father   . Hyperlipidemia Father   . Heart attack Father     Social History:  reports that he has been smoking. He has a 34.00 pack-year  smoking history. He has never used smokeless tobacco. He reports that he does not drink alcohol or use drugs.  ROS: All other review of systems were reviewed and are negative except what is noted above in HPI  Physical Exam: BP 136/75   Pulse 61   Temp 98.7 F (37.1 C)   Ht 5\' 10"  (1.778 m)   Wt 215 lb (97.5 kg)   BMI 30.85 kg/m   Constitutional:  Alert and oriented, No acute distress. HEENT: [place] AT, moist mucus membranes.  Trachea midline, no masses. Cardiovascular: No clubbing, cyanosis, or edema. Respiratory: Normal respiratory effort, no increased work of breathing. GI: Abdomen is soft, nontender, nondistended, no abdominal masses GU: No CVA tenderness.  Lymph: No cervical or inguinal lymphadenopathy. Skin: No rashes, bruises or suspicious lesions. Neurologic: Grossly intact, no focal deficits, moving all 4 extremities. Psychiatric: Normal mood and affect.  Laboratory Data: Lab Results  Component Value Date   WBC 24.1 (H) 12/22/2019   HGB 17.3 (H) 12/22/2019   HCT 52.5 (H) 12/22/2019   MCV 91.0 12/22/2019   PLT 285 12/22/2019    Lab Results  Component Value Date   CREATININE 1.55 (H) 12/22/2019    Lab Results  Component Value Date   PSA 0.71 09/24/2012    No results found for: TESTOSTERONE  No results found for: HGBA1C  Urinalysis    Component Value Date/Time   COLORURINE AMBER (A) 12/22/2019 1410   APPEARANCEUR CLEAR 12/22/2019 1410   LABSPEC 1.023 12/22/2019 1410   PHURINE 5.0 12/22/2019 1410   GLUCOSEU 50 (A) 12/22/2019 1410   HGBUR MODERATE (A) 12/22/2019 1410   BILIRUBINUR neg 12/29/2019 1553   KETONESUR 5 (A) 12/22/2019 1410   PROTEINUR Negative 12/29/2019 1553   PROTEINUR 30 (A) 12/22/2019 1410   UROBILINOGEN 0.2 12/29/2019 1553   NITRITE neg 12/29/2019 1553   NITRITE POSITIVE (A) 12/22/2019 1410   LEUKOCYTESUR Small (1+) (A) 12/29/2019 1553   LEUKOCYTESUR NEGATIVE 12/22/2019 1410    Lab Results  Component Value Date   BACTERIA RARE (A)  12/22/2019    Pertinent Imaging:  No results found for this or any previous visit. No results found for this or any previous visit. No results found for this or any previous visit. No results found for this or any previous visit. No results found for this or any previous visit. No results found for this or any previous visit. No results found for this or any previous visit. Results for orders placed during the hospital encounter of 12/22/19  CT Renal Stone Study   Narrative CLINICAL DATA:  Onset left flank pain radiating into the left lower quadrant at 4:30 a.m. today.  EXAM: CT ABDOMEN AND PELVIS WITHOUT CONTRAST  TECHNIQUE: Multidetector CT imaging of the abdomen and pelvis was performed following the standard protocol without IV contrast.  COMPARISON:  None.  FINDINGS: Lower chest: Lung bases clear.  No pleural or pericardial effusion.  Hepatobiliary: A few small low attenuating foci in the liver such as a 0.8 cm lesion in the caudate lobe on image 24 cannot be definitively characterized but are likely cysts. The liver otherwise appears normal. Gallbladder and biliary tree normal in appearance.  Pancreas: Unremarkable. No pancreatic ductal dilatation or surrounding inflammatory changes.  Spleen: Calcifications in the spleen consistent with old granulomatous disease noted. The spleen is normal in size.  Adrenals/Urinary Tract: Small bilateral adrenal adenomas are noted.  There is mild-to-moderate left hydronephrosis and stranding about the left kidney and proximal ureter due to a 0.5 cm proximal to mid left ureteral stone. No other urinary tract stones are seen on the right or left. The kidneys are otherwise normal appearance. Urinary bladder appears normal.  Stomach/Bowel: Stomach is within normal limits. Appendix appears normal. No evidence of bowel wall thickening, distention, or inflammatory changes.  Vascular/Lymphatic: No significant vascular findings are  present. No enlarged abdominal or pelvic lymph nodes.  Reproductive: Prostate is unremarkable.  Other: None.  Musculoskeletal: No acute or focal bony abnormality. 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 The patient is status post L5-S1 discectomy and fusion.  IMPRESSION: Mild to moderate left hydronephrosis due to a 0.5 cm proximal to mid left ureteral stone. No other acute abnormality.   Electronically Signed   By: Drusilla Kanner M.D.   On: 12/22/2019 12:09     Assessment & Plan:    1. Kidney stones -KUB today, will call with  Results. If stone is still present patient wants to pursue medical expulsive therapy. Continue flomax 0.4mg  daily. RTC 2 weeks with KUB   No follow-ups on file.  Wilkie Aye, MD  [facility] [place]

## 2020-01-12 NOTE — Progress Notes (Signed)

## 2020-02-02 ENCOUNTER — Other Ambulatory Visit: Payer: Self-pay

## 2020-02-02 ENCOUNTER — Ambulatory Visit (HOSPITAL_COMMUNITY)
Admission: RE | Admit: 2020-02-02 | Discharge: 2020-02-02 | Disposition: A | Discharge status: Home / Self Care | Payer: 59 | Source: Ambulatory Visit | Attending: Urology | Admitting: Urology

## 2020-02-02 DIAGNOSIS — N200 Calculus of kidney: Secondary | ICD-10-CM | POA: Diagnosis present

## 2020-02-04 ENCOUNTER — Other Ambulatory Visit: Payer: Self-pay

## 2020-02-04 ENCOUNTER — Ambulatory Visit: Payer: 59 | Admitting: Urology

## 2020-02-04 ENCOUNTER — Encounter: Payer: Self-pay | Admitting: Urology

## 2020-02-04 VITALS — BP 113/68 | HR 63 | Temp 98.8°F | Ht 70.0 in | Wt 215.0 lb

## 2020-02-04 DIAGNOSIS — N200 Calculus of kidney: Secondary | ICD-10-CM

## 2020-02-04 MED ORDER — TAMSULOSIN HCL 0.4 MG PO CAPS: 0.4000 mg | ORAL_CAPSULE | Freq: Every day | ORAL | 3 refills | Status: AC

## 2020-02-04 NOTE — Patient Instructions (Signed)
Dietary Guidelines to Help Prevent Kidney Stones Kidney stones are deposits of minerals and salts that form inside your kidneys. Your risk of developing kidney stones may be greater depending on your diet, your lifestyle, the medicines you take, and whether you have certain medical conditions. Most people can reduce their chances of developing kidney stones by following the instructions below. Depending on your overall health and the type of kidney stones you tend to develop, your dietitian may give you more specific instructions. What are tips for following this plan? Reading food labels  Choose foods with "no salt added" or "low-salt" labels. Limit your sodium intake to less than 1500 mg per day.  Choose foods with calcium for each meal and snack. Try to eat about 300 mg of calcium at each meal. Foods that contain 200-500 mg of calcium per serving include: ? 8 oz (237 ml) of milk, fortified nondairy milk, and fortified fruit juice. ? 8 oz (237 ml) of kefir, yogurt, and soy yogurt. ? 4 oz (118 ml) of tofu. ? 1 oz of cheese. ? 1 cup (300 g) of dried figs. ? 1 cup (91 g) of cooked broccoli. ? 1-3 oz can of sardines or mackerel.  Most people need 1000 to 1500 mg of calcium each day. Talk to your dietitian about how much calcium is recommended for you. Shopping  Buy plenty of fresh fruits and vegetables. Most people do not need to avoid fruits and vegetables, even if they contain nutrients that may contribute to kidney stones.  When shopping for convenience foods, choose: ? Whole pieces of fruit. ? Premade salads with dressing on the side. ? Low-fat fruit and yogurt smoothies.  Avoid buying frozen meals or prepared deli foods.  Look for foods with live cultures, such as yogurt and kefir. Cooking  Do not add salt to food when cooking. Place a salt shaker on the table and allow each person to add his or her own salt to taste.  Use vegetable protein, such as beans, textured vegetable  protein (TVP), or tofu instead of meat in pasta, casseroles, and soups. Meal planning   Eat less salt, if told by your dietitian. To do this: ? Avoid eating processed or premade food. ? Avoid eating fast food.  Eat less animal protein, including cheese, meat, poultry, or fish, if told by your dietitian. To do this: ? Limit the number of times you have meat, poultry, fish, or cheese each week. Eat a diet free of meat at least 2 days a week. ? Eat only one serving each day of meat, poultry, fish, or seafood. ? When you prepare animal protein, cut pieces into small portion sizes. For most meat and fish, one serving is about the size of one deck of cards.  Eat at least 5 servings of fresh fruits and vegetables each day. To do this: ? Keep fruits and vegetables on hand for snacks. ? Eat 1 piece of fruit or a handful of berries with breakfast. ? Have a salad and fruit at lunch. ? Have two kinds of vegetables at dinner.  Limit foods that are high in a substance called oxalate. These include: ? Spinach. ? Rhubarb. ? Beets. ? Potato chips and french fries. ? Nuts.  If you regularly take a diuretic medicine, make sure to eat at least 1-2 fruits or vegetables high in potassium each day. These include: ? Avocado. ? Banana. ? Orange, prune, carrot, or tomato juice. ? Baked potato. ? Cabbage. ? Beans and split   peas. General instructions   Drink enough fluid to keep your urine clear or pale yellow. This is the most important thing you can do.  Talk to your health care provider and dietitian about taking daily supplements. Depending on your health and the cause of your kidney stones, you may be advised: ? Not to take supplements with vitamin C. ? To take a calcium supplement. ? To take a daily probiotic supplement. ? To take other supplements such as magnesium, fish oil, or vitamin B6.  Take all medicines and supplements as told by your health care provider.  Limit alcohol intake to no  more than 1 drink a day for nonpregnant women and 2 drinks a day for men. One drink equals 12 oz of beer, 5 oz of wine, or 1 oz of hard liquor.  Lose weight if told by your health care provider. Work with your dietitian to find strategies and an eating plan that works best for you. What foods are not recommended? Limit your intake of the following foods, or as told by your dietitian. Talk to your dietitian about specific foods you should avoid based on the type of kidney stones and your overall health. Grains Breads. Bagels. Rolls. Baked goods. Salted crackers. Cereal. Pasta. Vegetables Spinach. Rhubarb. Beets. Canned vegetables. Pickles. Olives. Meats and other protein foods Nuts. Nut butters. Large portions of meat, poultry, or fish. Salted or cured meats. Deli meats. Hot dogs. Sausages. Dairy Cheese. Beverages Regular soft drinks. Regular vegetable juice. Seasonings and other foods Seasoning blends with salt. Salad dressings. Canned soups. Soy sauce. Ketchup. Barbecue sauce. Canned pasta sauce. Casseroles. Pizza. Lasagna. Frozen meals. Potato chips. French fries. Summary  You can reduce your risk of kidney stones by making changes to your diet.  The most important thing you can do is drink enough fluid. You should drink enough fluid to keep your urine clear or pale yellow.  Ask your health care provider or dietitian how much protein from animal sources you should eat each day, and also how much salt and calcium you should have each day. This information is not intended to replace advice given to you by your health care provider. Make sure you discuss any questions you have with your health care provider. Document Revised: 11/27/2018 Document Reviewed: 07/18/2016 Elsevier Patient Education  2020 [employer]  

## 2020-02-04 NOTE — Progress Notes (Signed)
02/04/2020 11:55 AM   William Sweeney 07-Oct-1970 [id number]  Referring provider: No referring provider defined for this encounter.  Nephrolithiasis  HPI: William Sweeney is a 49yo here for followup for nephrolithiasis. He denies any flank pain. KUB from 6/14 shows no left calculus. No LUTS. No hematuria. No fevers. He is urinating better on flomax 0.4mg  daily   PMH: Past Medical History:  Diagnosis Date  . Acid reflux     Surgical History: Past Surgical History:  Procedure Laterality Date  . BACK SURGERY  2013  . [place]  . VASECTOMY  1999    Home Medications:  Allergies as of 02/04/2020   No Known Allergies     Medication List       Accurate as of February 04, 2020 11:55 AM. If you have any questions, ask your nurse or doctor.        cephALEXin 500 MG capsule Commonly known as: KEFLEX Take 1 capsule (500 mg total) by mouth 4 (four) times daily.   HYDROcodone-acetaminophen 5-325 MG tablet Commonly known as: NORCO/VICODIN Take 1 tablet by mouth every 6 (six) hours as needed.   ibuprofen 200 MG tablet Commonly known as: ADVIL Take 600 mg by mouth daily as needed for moderate pain.   ondansetron 4 MG disintegrating tablet Commonly known as: Zofran ODT 4mg  ODT q4 hours prn nausea/vomit   oxyCODONE-acetaminophen 5-325 MG tablet Commonly known as: Percocet Take 1 tablet by mouth every 4 (four) hours as needed for moderate pain or severe pain.   phenazopyridine 95 MG tablet Commonly known as: PYRIDIUM Take 190 mg by mouth 3 (three) times daily as needed for pain.   tamsulosin 0.4 MG Caps capsule Commonly known as: Flomax Take 1 capsule (0.4 mg total) by mouth daily.       Allergies: No Known Allergies  Family History: Family History  Problem Relation Age of Onset  . Hypertension Mother   . Heart disease Father   . Hyperlipidemia Father   . Heart attack Father     Social History:  reports that he has been smoking. He has a 34.00  pack-year smoking history. He has never used smokeless tobacco. He reports that he does not drink alcohol and does not use drugs.  ROS: All other review of systems were reviewed and are negative except what is noted above in HPI  Physical Exam: BP 113/68   Pulse 63   Temp 98.8 F (37.1 C)   Ht 5\' 10"  (1.778 m)   Wt 215 lb (97.5 kg)   BMI 30.85 kg/m   Constitutional:  Alert and oriented, No acute distress. HEENT: [place] AT, moist mucus membranes.  Trachea midline, no masses. Cardiovascular: No clubbing, cyanosis, or edema. Respiratory: Normal respiratory effort, no increased work of breathing. GI: Abdomen is soft, nontender, nondistended, no abdominal masses GU: No CVA tenderness.  Lymph: No cervical or inguinal lymphadenopathy. Skin: No rashes, bruises or suspicious lesions. Neurologic: Grossly intact, no focal deficits, moving all 4 extremities. Psychiatric: Normal mood and affect.  Laboratory Data: Lab Results  Component Value Date   WBC 24.1 (H) 12/22/2019   HGB 17.3 (H) 12/22/2019   HCT 52.5 (H) 12/22/2019   MCV 91.0 12/22/2019   PLT 285 12/22/2019    Lab Results  Component Value Date   CREATININE 1.55 (H) 12/22/2019    Lab Results  Component Value Date   PSA 0.71 09/24/2012    No results found for: TESTOSTERONE  No results found for:  HGBA1C  Urinalysis    Component Value Date/Time   COLORURINE AMBER (A) 12/22/2019 1410   APPEARANCEUR CLEAR 12/22/2019 1410   LABSPEC 1.023 12/22/2019 1410   PHURINE 5.0 12/22/2019 1410   GLUCOSEU 50 (A) 12/22/2019 1410   HGBUR MODERATE (A) 12/22/2019 1410   BILIRUBINUR neg 12/29/2019 1553   KETONESUR 5 (A) 12/22/2019 1410   PROTEINUR Negative 12/29/2019 1553   PROTEINUR 30 (A) 12/22/2019 1410   UROBILINOGEN 0.2 12/29/2019 1553   NITRITE neg 12/29/2019 1553   NITRITE POSITIVE (A) 12/22/2019 1410   LEUKOCYTESUR Small (1+) (A) 12/29/2019 1553   LEUKOCYTESUR NEGATIVE 12/22/2019 1410    Lab Results  Component Value Date     BACTERIA RARE (A) 12/22/2019    Pertinent Imaging: KUB 6/14: images reviewed and disucssed with the patient Results for orders placed during the hospital encounter of 02/02/20  Abdomen 1 view (KUB)  Narrative CLINICAL DATA:  Left flank pain for several days.  EXAM: ABDOMEN - 1 VIEW  COMPARISON:  None.  FINDINGS: The bowel gas pattern is normal. No radio-opaque calculi or other significant radiographic abnormality are seen. The patient is status post L5-S1 fusion. There is degenerative change about the hips.  IMPRESSION: No acute abnormality.  No evidence of urinary tract stone.   Electronically Signed By: Drusilla Kanner M.D. On: 02/02/2020 15:49  No results found for this or any previous visit.  No results found for this or any previous visit.  No results found for this or any previous visit.  No results found for this or any previous visit.  No results found for this or any previous visit.  No results found for this or any previous visit.  Results for orders placed during the hospital encounter of 12/22/19  CT Renal Stone Study  Narrative CLINICAL DATA:  Onset left flank pain radiating into the left lower quadrant at 4:30 a.m. today.  EXAM: CT ABDOMEN AND PELVIS WITHOUT CONTRAST  TECHNIQUE: Multidetector CT imaging of the abdomen and pelvis was performed following the standard protocol without IV contrast.  COMPARISON:  None.  FINDINGS: Lower chest: Lung bases clear.  No pleural or pericardial effusion.  Hepatobiliary: A few small low attenuating foci in the liver such as a 0.8 cm lesion in the caudate lobe on image 24 cannot be definitively characterized but are likely cysts. The liver otherwise appears normal. Gallbladder and biliary tree normal in appearance.  Pancreas: Unremarkable. No pancreatic ductal dilatation or surrounding inflammatory changes.  Spleen: Calcifications in the spleen consistent with old granulomatous disease noted.  The spleen is normal in size.  Adrenals/Urinary Tract: Small bilateral adrenal adenomas are noted.  There is mild-to-moderate left hydronephrosis and stranding about the left kidney and proximal ureter due to a 0.5 cm proximal to mid left ureteral stone. No other urinary tract stones are seen on the right or left. The kidneys are otherwise normal appearance. Urinary bladder appears normal.  Stomach/Bowel: Stomach is within normal limits. Appendix appears normal. No evidence of bowel wall thickening, distention, or inflammatory changes.  Vascular/Lymphatic: No significant vascular findings are present. No enlarged abdominal or pelvic lymph nodes.  Reproductive: Prostate is unremarkable.  Other: None.  Musculoskeletal: No acute or focal bony abnormality. 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 The patient is status post L5-S1 discectomy and fusion.  IMPRESSION: Mild to moderate left hydronephrosis due to a 0.5 cm proximal to mid left ureteral stone. No other acute abnormality.   Electronically Signed By: Drusilla Kanner  M.D. On: 12/22/2019 12:09   Assessment & Plan:   1. Nephrolithiasis: -RTC 3 months with renal US and KUB  No follow-ups on file.  Wilkie Aye, MD  [facility] [place]

## 2020-02-04 NOTE — Progress Notes (Signed)
Urological Symptom Review  Patient is experiencing the following symptoms: Kidney Stones    Review of Systems  Gastrointestinal (upper)  : Negative for upper GI symptoms  Gastrointestinal (lower) : Negative for lower GI symptoms  Constitutional : Negative for symptoms  Skin: Negative for skin symptoms  Eyes: Negative for eye symptoms  Ear/Nose/Throat : Negative for Ear/Nose/Throat symptoms  Hematologic/Lymphatic: Negative for Hematologic/Lymphatic symptoms  Cardiovascular : Negative for cardiovascular symptoms  Respiratory : Negative for respiratory symptoms  Endocrine: Negative for endocrine symptoms  Musculoskeletal: Negative for musculoskeletal symptoms  Neurological: Negative for neurological symptoms  Psychologic: Negative for psychiatric symptoms  

## 2020-05-03 ENCOUNTER — Ambulatory Visit: Payer: 59 | Admitting: Urology

## 2020-05-06 ENCOUNTER — Ambulatory Visit: Payer: 59 | Admitting: Urology

## 2021-07-15 IMAGING — CT CT RENAL STONE PROTOCOL
2 of 3 series · 16 of 46 positions shown, 18 images · non-contrast
Comparison: None.

CLINICAL DATA: Onset left flank pain radiating into the left lower
quadrant at [DATE] a.m. today.

EXAM:
CT ABDOMEN AND PELVIS WITHOUT CONTRAST
TECHNIQUE: Multidetector CT imaging of the abdomen and pelvis was performed
following the standard protocol without IV contrast.

[Series 2: axial st · axial · 0.78mm/px · z∈[+927,+1357]mm · 13 of 100 slices shown, 15 images]
[im 7/100  soft-tissue]
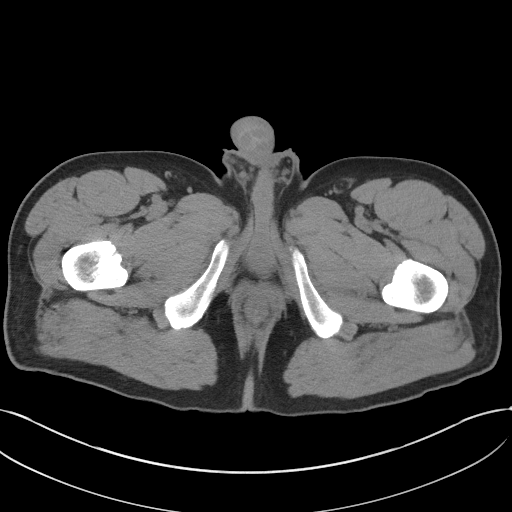
[im 7/100  bone]
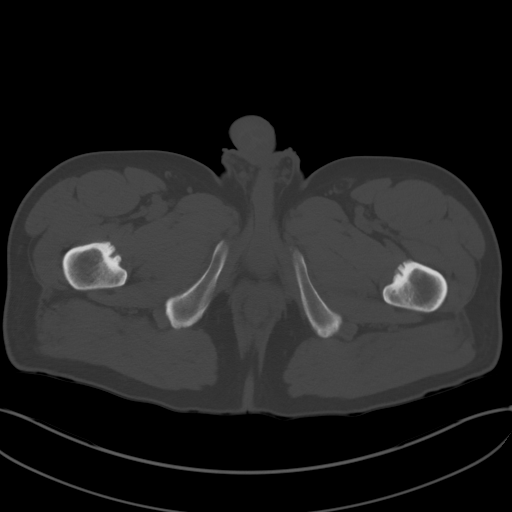
[im 13/100  soft-tissue]
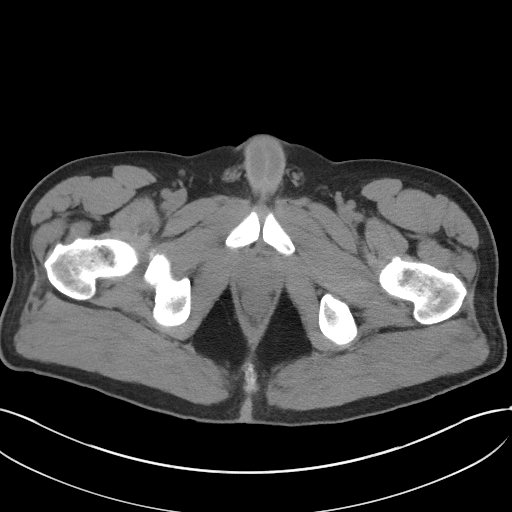
[im 20/100  soft-tissue]
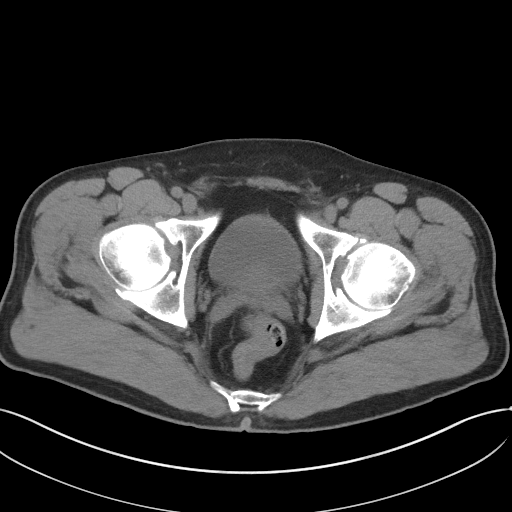
[im 29/100  soft-tissue]
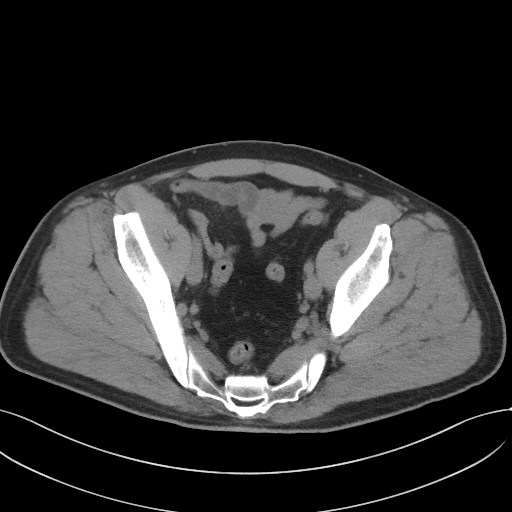
[im 36/100  soft-tissue]
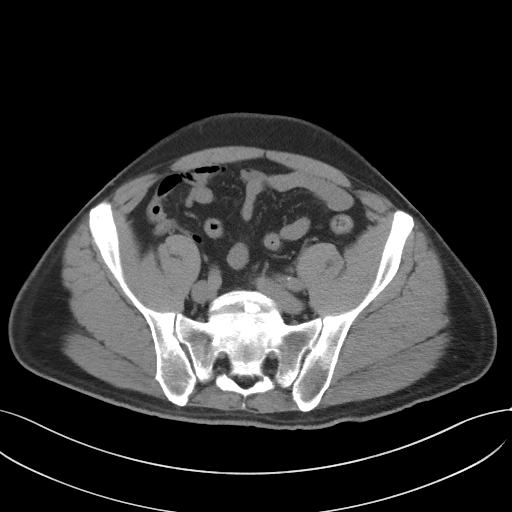
[im 42/100  soft-tissue]
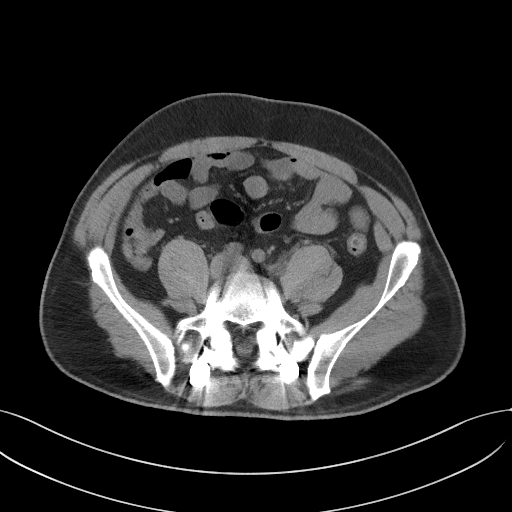
[im 52/100  soft-tissue]
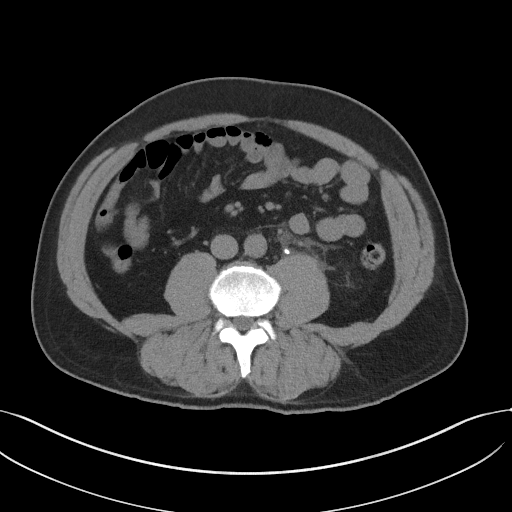
[im 58/100  soft-tissue]
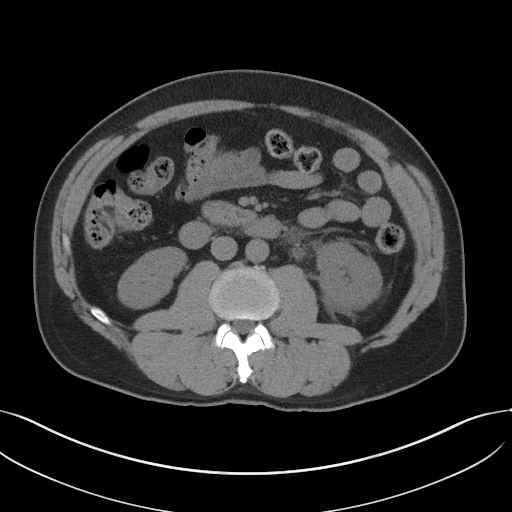
[im 64/100  soft-tissue]
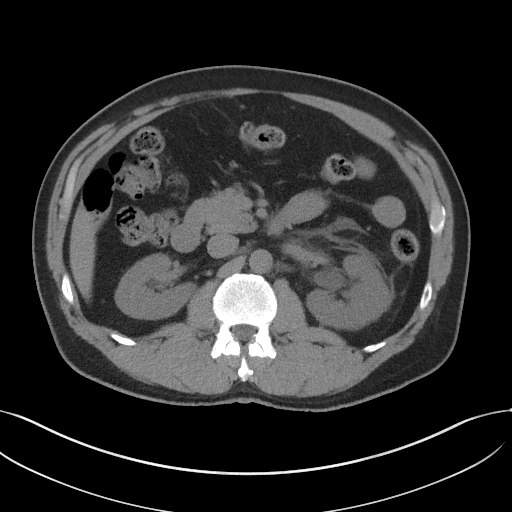
[im 64/100  bone]
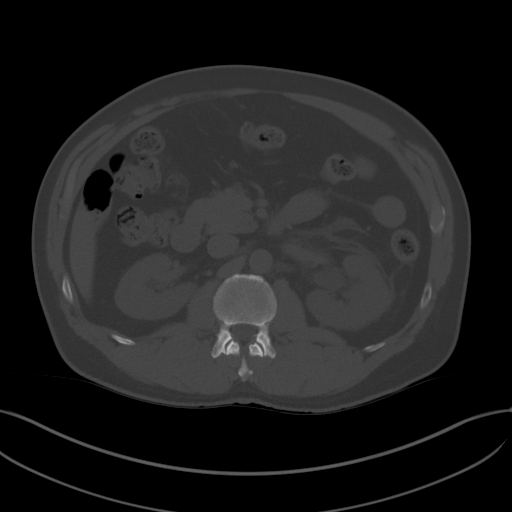
[im 71/100  soft-tissue]
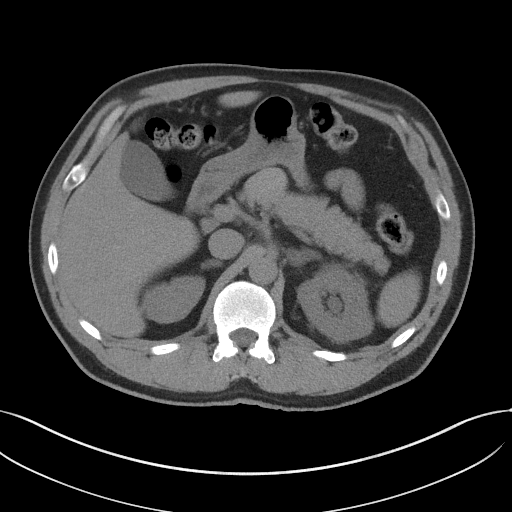
[im 80/100  soft-tissue]
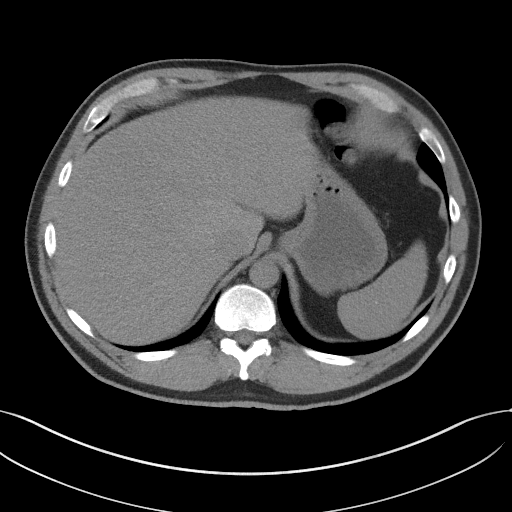
[im 87/100  soft-tissue]
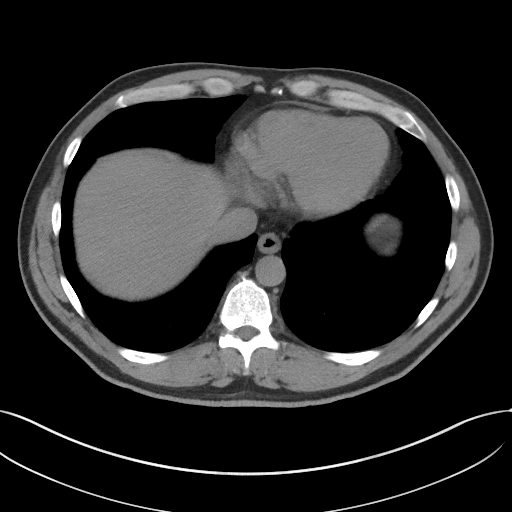
[im 93/100  soft-tissue]
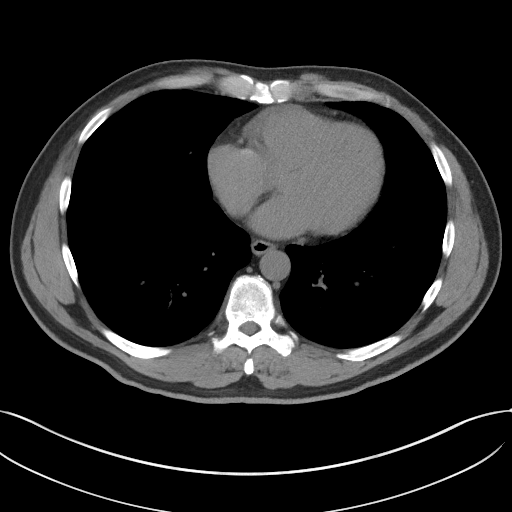

[Series 5: coronal st · coronal · 0.82mm/px · 3 of 101 slices shown]
[im 34/101  soft-tissue]
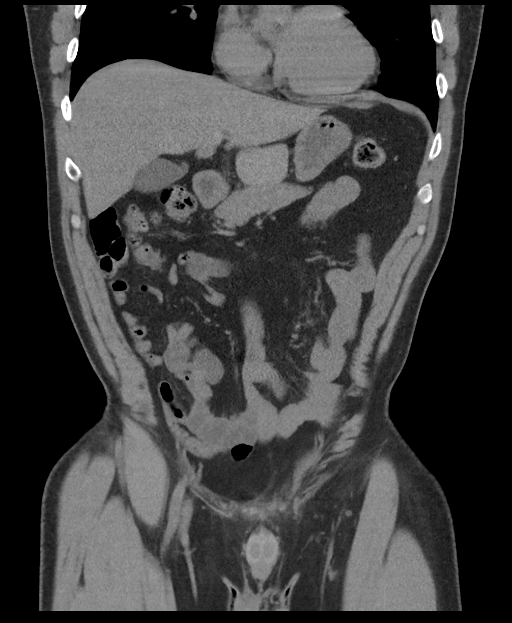
[im 45/101  soft-tissue]
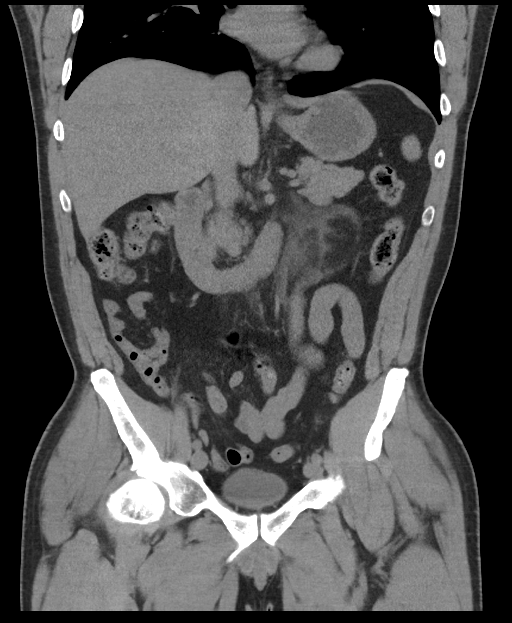
[im 56/101  soft-tissue]
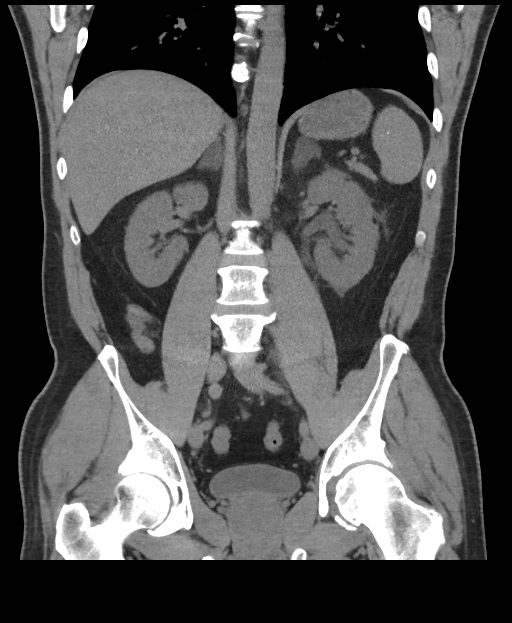

[16 of 46 positions shown; findings below may reference images not displayed]

FINDINGS: Lower chest: Lung bases clear.  No pleural or pericardial effusion.

Hepatobiliary: A few small low attenuating foci in the liver such as
a 0.8 cm lesion in the caudate lobe on image 24 cannot be
definitively characterized but are likely cysts. The liver otherwise
appears normal. Gallbladder and biliary tree normal in appearance.

Pancreas: Unremarkable. No pancreatic ductal dilatation or
surrounding inflammatory changes.

Spleen: Calcifications in the spleen consistent with old
granulomatous disease noted. The spleen is normal in size.

Adrenals/Urinary Tract: Small bilateral adrenal adenomas are noted.

There is mild-to-moderate left hydronephrosis and stranding about
the left kidney and proximal ureter due to a 0.5 cm proximal to mid
left ureteral stone. No other urinary tract stones are seen on the
right or left. The kidneys are otherwise normal appearance. Urinary
bladder appears normal.

Stomach/Bowel: Stomach is within normal limits. Appendix appears
normal. No evidence of bowel wall thickening, distention, or
inflammatory changes.

Vascular/Lymphatic: No significant vascular findings are present. No
enlarged abdominal or pelvic lymph nodes.

Reproductive: Prostate is unremarkable.

Other: None.

Musculoskeletal: No acute or focal bony abnormality. Mild to
moderate hip osteoarthritis is worse on the right. The patient is
status post L5-S1 discectomy and fusion.
IMPRESSION: Mild to moderate left hydronephrosis due to a 0.5 cm proximal to mid
left ureteral stone. No other acute abnormality.

## 2021-08-26 IMAGING — DX DG ABDOMEN 1V
2 series · 2 of 2 positions shown · non-contrast
Comparison: None.

CLINICAL DATA: Left flank pain for several days.

EXAM:
ABDOMEN - 1 VIEW

[abdomen kub (1 of 2)]
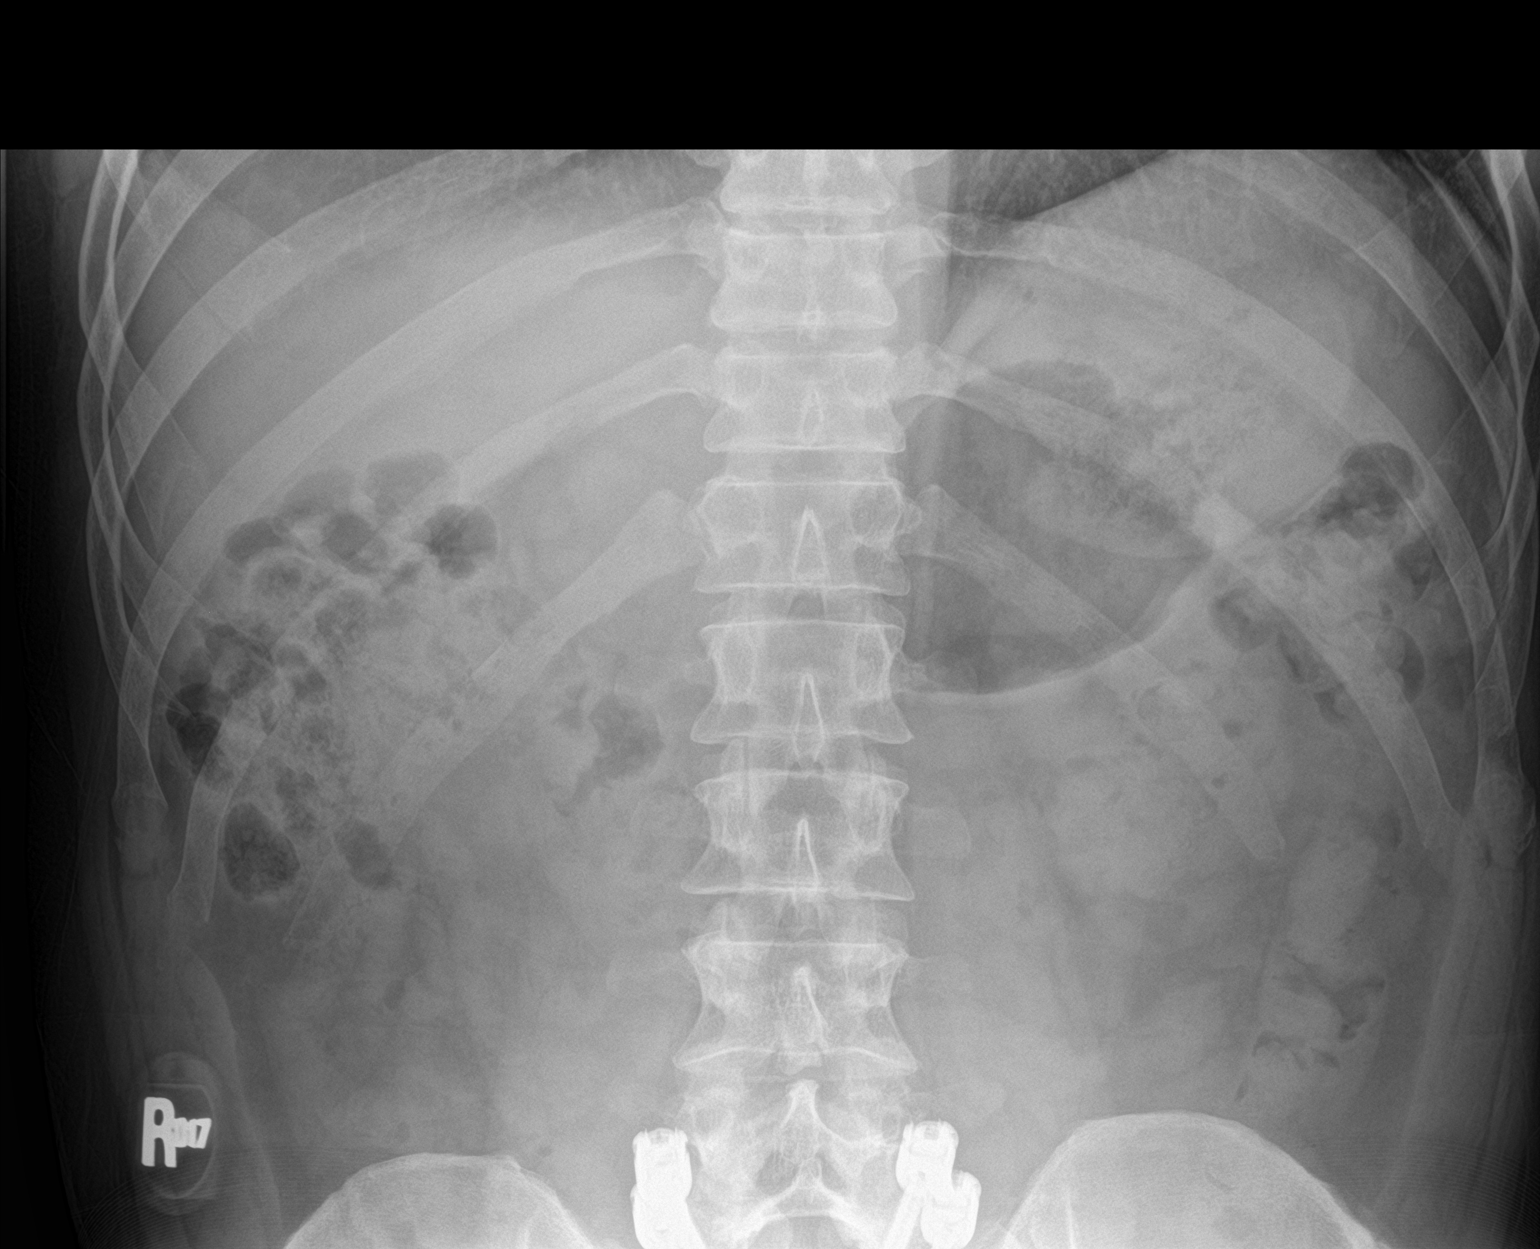

[abdomen kub (2 of 2)]
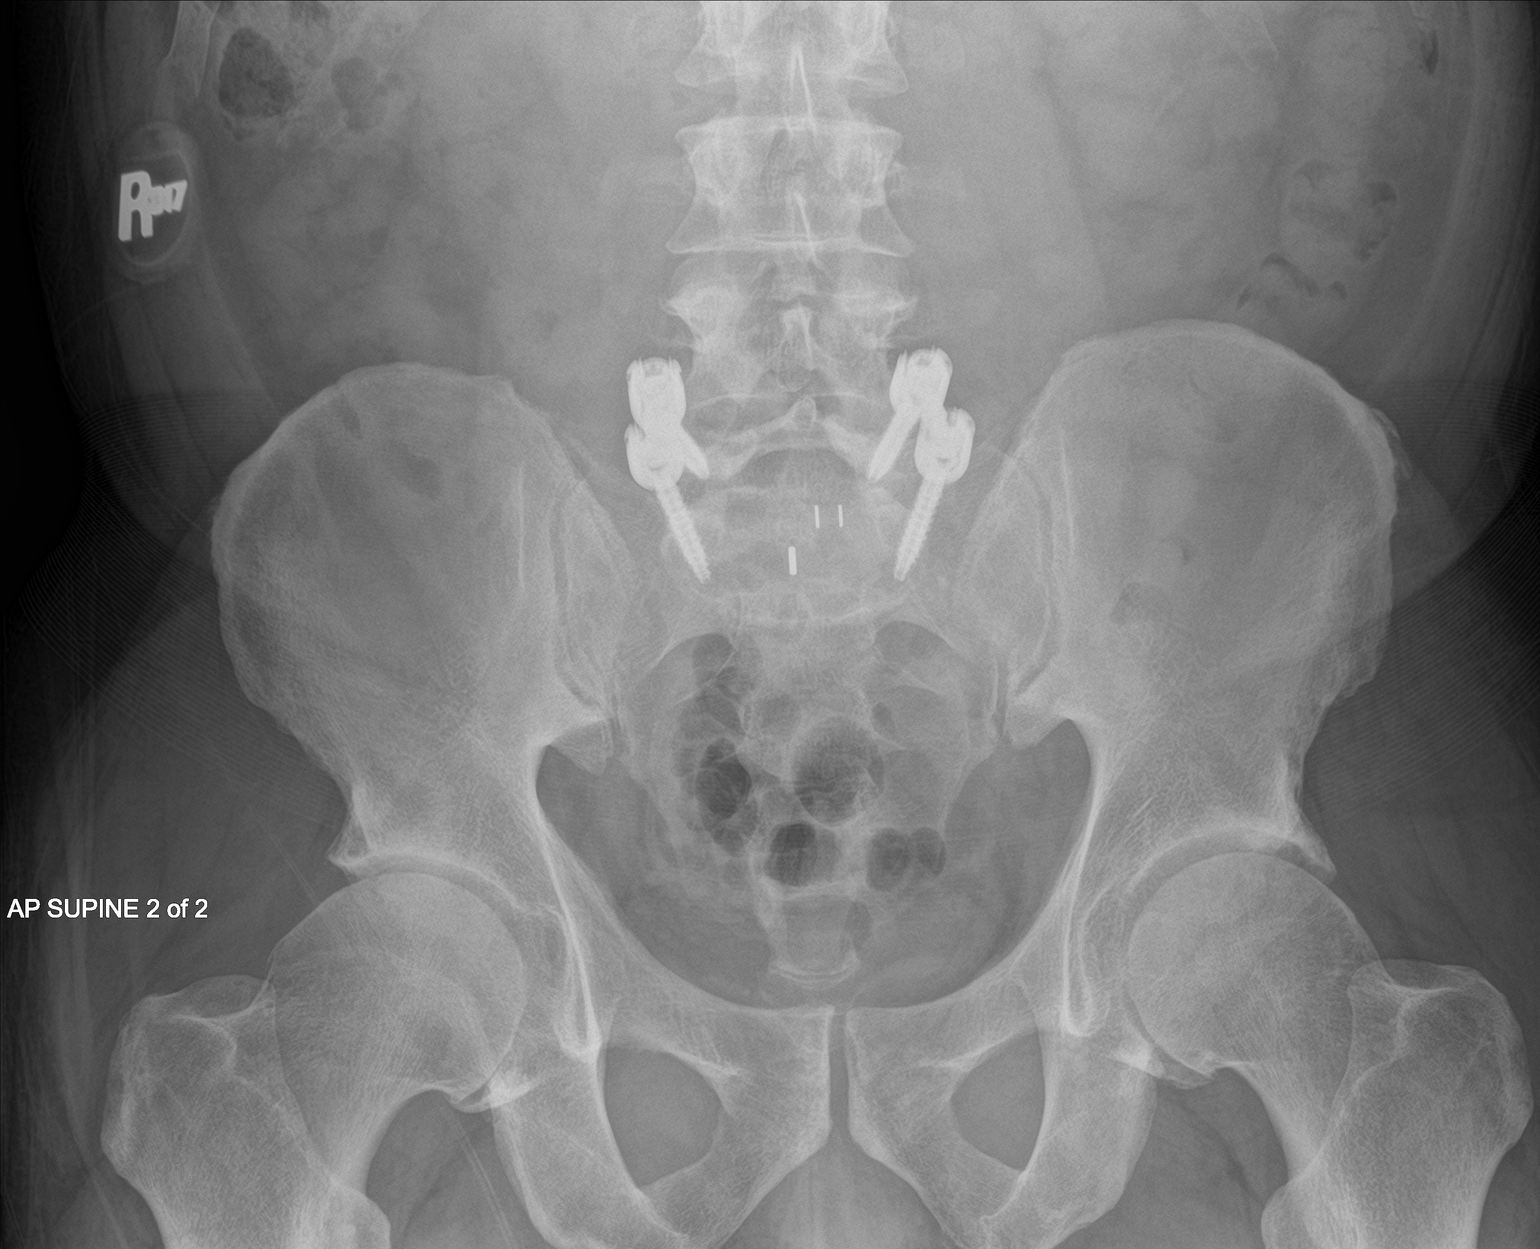

[2 of 2 positions shown; findings below may reference images not displayed]

FINDINGS: The bowel gas pattern is normal. No radio-opaque calculi or other
significant radiographic abnormality are seen. The patient is status
post L5-S1 fusion. There is degenerative change about the hips.
IMPRESSION: No acute abnormality.  No evidence of urinary tract stone.
